# Patient Record
Sex: Female | Born: 1950 | Race: White | Hispanic: No | Marital: Married | Smoking: Former smoker
Health system: Southern US, Community
[De-identification: ages and names within clinical notes are randomized; demographics above are authoritative.]

## PROBLEM LIST (undated history)

## (undated) DIAGNOSIS — Z9889 Other specified postprocedural states: Secondary | ICD-10-CM

## (undated) DIAGNOSIS — R112 Nausea with vomiting, unspecified: Secondary | ICD-10-CM

## (undated) DIAGNOSIS — L989 Disorder of the skin and subcutaneous tissue, unspecified: Secondary | ICD-10-CM

## (undated) DIAGNOSIS — F329 Major depressive disorder, single episode, unspecified: Secondary | ICD-10-CM

## (undated) DIAGNOSIS — R519 Headache, unspecified: Secondary | ICD-10-CM

## (undated) DIAGNOSIS — F32A Depression, unspecified: Secondary | ICD-10-CM

## (undated) DIAGNOSIS — F419 Anxiety disorder, unspecified: Secondary | ICD-10-CM

## (undated) DIAGNOSIS — M1990 Unspecified osteoarthritis, unspecified site: Secondary | ICD-10-CM

## (undated) DIAGNOSIS — I10 Essential (primary) hypertension: Secondary | ICD-10-CM

## (undated) HISTORY — PX: FOOT SURGERY: SHX648

## (undated) HISTORY — PX: ROTATOR CUFF REPAIR: SHX139

## (undated) HISTORY — PX: APPENDECTOMY: SHX54

## (undated) HISTORY — PX: NECK SURGERY: SHX720

## (undated) HISTORY — PX: ABDOMINAL HYSTERECTOMY: SHX81

---
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
## 2020-01-07 ENCOUNTER — Other Ambulatory Visit: Payer: Self-pay | Admitting: Orthopedic Surgery

## 2020-01-12 ENCOUNTER — Encounter (HOSPITAL_COMMUNITY): Payer: Self-pay

## 2020-01-12 NOTE — Patient Instructions (Signed)
DUE TO COVID-19 ONLY ONE VISITOR ARE ALLOWED TO COME WITH YOU AND STAY IN THE WAITING ROOM ONLY DURING PRE OP AND PROCEDURE. THEN TWO VISITORS MAY VISIT WITH YOU IN YOUR PRIVATE ROOM DURING VISITING HOURS ONLY!! (10AM-8PM)   COVID SWAB TESTING MUST BE COMPLETED ON:  Today at 1:05 PM   [address] [place], [place] [address]  (Must self quarantine after testing. Follow instructions on handout.)       Your procedure is scheduled on: Thursday, Aug. 5, 2021   Report to [facility] Main  Entrance   Report to Short Stay at 5:30 AM   [facility])   Call this number if you have problems the morning of surgery [phone]   Do not eat food :After Midnight.   May have liquids until 4:30 AM   day of surgery  CLEAR LIQUID DIET  Foods Allowed                                                                     Foods Excluded  Water, Black Coffee and tea, regular and decaf                             liquids that you cannot  Plain Jell-O in any flavor  (No red)                                           see through such as: Fruit ices (not with fruit pulp)                                     milk, soups, orange juice              Iced Popsicles (No red)                                    All solid food                                   Apple juices Sports drinks like Gatorade (No red) Lightly seasoned clear broth or consume(fat free) Sugar, honey syrup  Sample Menu Breakfast                                Lunch                                     Supper Cranberry juice                    Beef broth                            Chicken broth Jell-O  Grape juice                           Apple juice Coffee or tea                        Jell-O                                      Popsicle                                                Coffee or tea                        Coffee or tea      Complete one Ensure drink the morning of surgery at    4:30  AM   the day of surgery.   Oral Hygiene is also important to reduce your risk of infection.                                    Remember - BRUSH YOUR TEETH THE MORNING OF SURGERY WITH YOUR REGULAR TOOTHPASTE   Do NOT smoke after Midnight   Take these medicines the morning of surgery with A SIP OF WATER: Venlafaxine                               You may not have any metal on your body including hair pins, jewelry, and body piercings             Do not wear make-up, lotions, powders, perfumes/cologne, or deodorant             Do not wear nail polish.  Do not shave  48 hours prior to surgery.                Do not bring valuables to the hospital. [facility] IS NOT             RESPONSIBLE   FOR VALUABLES.   Contacts, dentures or bridgework may not be worn into surgery.   Bring small overnight bag day of surgery.   Special Instructions: Bring a copy of your healthcare power of attorney and living will documents         the day of surgery if you haven't scanned them in before.              Please read over the following fact sheets you were given: IF YOU HAVE QUESTIONS ABOUT YOUR PRE OP INSTRUCTIONS PLEASE CALL [phone]  [place]- Preparing for Total Shoulder Arthroplasty    Before surgery, you can play an important role. Because skin is not sterile, your skin needs to be as free of germs as possible. You can reduce the number of germs on your skin by using the following products. . Benzoyl Peroxide Gel o Reduces the number of germs present on the skin o Applied twice a day to shoulder area starting two days before surgery    ==================================================================  Please follow these instructions carefully:  BENZOYL PEROXIDE 5%  GEL  Please do not use if you have an allergy to benzoyl peroxide.   If your skin becomes reddened/irritated stop using the benzoyl peroxide.  Starting two days before surgery, apply as follows: (Tuesday and  Wednesday) 1. Apply benzoyl peroxide in the morning and at night. Apply after taking a shower. If you are not taking a shower clean entire shoulder front, back, and side along with the armpit with a clean wet washcloth.  2. Place a quarter-sized dollop on your shoulder and rub in thoroughly, making sure to cover the front, back, and side of your shoulder, along with the armpit.   2 days before ____ AM   ____ PM              1 day before ____ AM   ____ PM                         3. Do this twice a day for two days.  (Last application is the night before surgery, AFTER using the CHG soap as described below).  4. Do NOT apply benzoyl peroxide gel on the day of surgery.    [place] - Preparing for Surgery Before surgery, you can play an important role.  Because skin is not sterile, your skin needs to be as free of germs as possible.  You can reduce the number of germs on your skin by washing with CHG (chlorahexidine gluconate) soap before surgery.  CHG is an antiseptic cleaner which kills germs and bonds with the skin to continue killing germs even after washing. Please DO NOT use if you have an allergy to CHG or antibacterial soaps.  If your skin becomes reddened/irritated stop using the CHG and inform your nurse when you arrive at Short Stay. Do not shave (including legs and underarms) for at least 48 hours prior to the first CHG shower.  You may shave your face/neck.  Please follow these instructions carefully:  1.  Shower with CHG Soap the night before surgery and the  morning of surgery.  2.  If you choose to wash your hair, wash your hair first as usual with your normal  shampoo.  3.  After you shampoo, rinse your hair and body thoroughly to remove the shampoo.                             4.  Use CHG as you would any other liquid soap.  You can apply chg directly to the skin and wash.  Gently with a scrungie or clean washcloth.  5.  Apply the CHG Soap to your body ONLY FROM THE NECK DOWN.    Do   not use on face/ open                           Wound or open sores. Avoid contact with eyes, ears mouth and   genitals (private parts).                       Wash face,  Genitals (private parts) with your normal soap.             6.  Wash thoroughly, paying special attention to the area where your    surgery  will be performed.  7.  Thoroughly rinse your body with warm water from the neck down.  8.  DO NOT shower/wash with your normal soap after using and rinsing off the CHG Soap.                9.  Pat yourself dry with a clean towel.            10.  Wear clean pajamas.            11.  Place clean sheets on your bed the night of your first shower and do not  sleep with pets. Day of Surgery : Do not apply any lotions/deodorants the morning of surgery.  Please wear clean clothes to the hospital/surgery center.  FAILURE TO FOLLOW THESE INSTRUCTIONS MAY RESULT IN THE CANCELLATION OF YOUR SURGERY  PATIENT SIGNATURE_________________________________  NURSE SIGNATURE__________________________________  ________________________________________________________________________  WHAT IS A BLOOD TRANSFUSION? Blood Transfusion Information  A transfusion is the replacement of blood or some of its parts. Blood is made up of multiple cells which provide different functions.  Red blood cells carry oxygen and are used for blood loss replacement.  White blood cells fight against infection.  Platelets control bleeding.  Plasma helps clot blood.  Other blood products are available for specialized needs, such as hemophilia or other clotting disorders. BEFORE THE TRANSFUSION  Who gives blood for transfusions?   Healthy volunteers who are fully evaluated to make sure their blood is safe. This is blood bank blood. Transfusion therapy is the safest it has ever been in the practice of medicine. Before blood is taken from a donor, a complete history is taken to make sure that person has no history of  diseases nor engages in risky social behavior (examples are intravenous drug use or sexual activity with multiple partners). The donor's travel history is screened to minimize risk of transmitting infections, such as malaria. The donated blood is tested for signs of infectious diseases, such as HIV and hepatitis. The blood is then tested to be sure it is compatible with you in order to minimize the chance of a transfusion reaction. If you or a relative donates blood, this is often done in anticipation of surgery and is not appropriate for emergency situations. It takes many days to process the donated blood. RISKS AND COMPLICATIONS Although transfusion therapy is very safe and saves many lives, the main dangers of transfusion include:   Getting an infectious disease.  Developing a transfusion reaction. This is an allergic reaction to something in the blood you were given. Every precaution is taken to prevent this. The decision to have a blood transfusion has been considered carefully by your caregiver before blood is given. Blood is not given unless the benefits outweigh the risks. AFTER THE TRANSFUSION  Right after receiving a blood transfusion, you will usually feel much better and more energetic. This is especially true if your red blood cells have gotten low (anemic). The transfusion raises the level of the red blood cells which carry oxygen, and this usually causes an energy increase.  The nurse administering the transfusion will monitor you carefully for complications. HOME CARE INSTRUCTIONS  No special instructions are needed after a transfusion. You may find your energy is better. Speak with your caregiver about any limitations on activity for underlying diseases you may have. SEEK MEDICAL CARE IF:   Your condition is not improving after your transfusion.  You develop redness or irritation at the intravenous (IV) site. SEEK IMMEDIATE MEDICAL CARE IF:  Any of the following symptoms occur  over the next 12 hours:  Shaking chills.  You  have a temperature by mouth above 102 F (38.9 C), not controlled by medicine.  Chest, back, or muscle pain.  People around you feel you are not acting correctly or are confused.  Shortness of breath or difficulty breathing.  Dizziness and fainting.  You get a rash or develop hives.  You have a decrease in urine output.  Your urine turns a dark color or changes to pink, red, or brown. Any of the following symptoms occur over the next 10 days:  You have a temperature by mouth above 102 F (38.9 C), not controlled by medicine.  Shortness of breath.  Weakness after normal activity.  The white part of the eye turns yellow (jaundice).  You have a decrease in the amount of urine or are urinating less often.  Your urine turns a dark color or changes to pink, red, or brown. Document Released: 06/01/2000 Document Revised: 08/27/2011 Document Reviewed: 01/19/2008 [facility] Patient Information 2014 [place], [place].  _______________________________________________________________________

## 2020-01-12 NOTE — Progress Notes (Signed)
COVID Vaccine Completed: Date COVID Vaccine completed: COVID vaccine manufacturer: Pfizer    Moderna   [employer]   PCP -  Cardiologist -   Chest x-ray -  EKG - 01/18/2020 in epic (pre op) Stress Test -  ECHO -  Cardiac Cath -   Sleep Study -  CPAP -   Fasting Blood Sugar -  Checks Blood Sugar _____ times a day  Blood Thinner Instructions: Aspirin Instructions: Last Dose:  Anesthesia review:   Patient denies shortness of breath, fever, cough and chest pain at PAT appointment   Patient verbalized understanding of instructions that were given to them at the PAT appointment. Patient was also instructed that they will need to review over the PAT instructions again at home before surgery.

## 2020-01-18 ENCOUNTER — Ambulatory Visit (HOSPITAL_COMMUNITY)
Admission: RE | Admit: 2020-01-18 | Discharge: 2020-01-18 | Disposition: A | Discharge status: Home / Self Care | Payer: Worker's Compensation | Source: Ambulatory Visit | Attending: Orthopedic Surgery | Admitting: Orthopedic Surgery

## 2020-01-18 ENCOUNTER — Other Ambulatory Visit: Payer: Self-pay

## 2020-01-18 ENCOUNTER — Encounter (HOSPITAL_COMMUNITY): Payer: Self-pay

## 2020-01-18 ENCOUNTER — Encounter (HOSPITAL_COMMUNITY)
Admission: RE | Admit: 2020-01-18 | Discharge: 2020-01-18 | Disposition: A | Discharge status: Home / Self Care | Payer: Worker's Compensation | Source: Ambulatory Visit | Attending: Orthopedic Surgery | Admitting: Orthopedic Surgery

## 2020-01-18 ENCOUNTER — Other Ambulatory Visit (HOSPITAL_COMMUNITY)
Admission: RE | Admit: 2020-01-18 | Discharge: 2020-01-18 | Disposition: A | Discharge status: Home / Self Care | Payer: Worker's Compensation | Source: Ambulatory Visit | Attending: Orthopedic Surgery | Admitting: Orthopedic Surgery

## 2020-01-18 DIAGNOSIS — Z20822 Contact with and (suspected) exposure to covid-19: Secondary | ICD-10-CM | POA: Insufficient documentation

## 2020-01-18 DIAGNOSIS — Z01811 Encounter for preprocedural respiratory examination: Secondary | ICD-10-CM

## 2020-01-18 DIAGNOSIS — Z01818 Encounter for other preprocedural examination: Secondary | ICD-10-CM | POA: Diagnosis present

## 2020-01-18 DIAGNOSIS — Z01812 Encounter for preprocedural laboratory examination: Secondary | ICD-10-CM | POA: Diagnosis present

## 2020-01-18 HISTORY — DX: Anxiety disorder, unspecified: F41.9

## 2020-01-18 HISTORY — DX: Other specified postprocedural states: R11.2

## 2020-01-18 HISTORY — DX: Depression, unspecified: F32.A

## 2020-01-18 HISTORY — DX: Unspecified osteoarthritis, unspecified site: M19.90

## 2020-01-18 HISTORY — DX: Disorder of the skin and subcutaneous tissue, unspecified: L98.9

## 2020-01-18 HISTORY — DX: Other specified postprocedural states: Z98.890

## 2020-01-18 HISTORY — DX: Headache, unspecified: R51.9

## 2020-01-18 HISTORY — DX: Essential (primary) hypertension: I10

## 2020-01-18 LAB — COMPREHENSIVE METABOLIC PANEL
ALT: 10 U/L (ref 0–44)
AST: 21 U/L (ref 15–41)
Albumin: 4.6 g/dL (ref 3.5–5.0)
Alkaline Phosphatase: 105 U/L (ref 38–126)
Anion gap: 9 (ref 5–15)
BUN: 20 mg/dL (ref 8–23)
CO2: 27 mmol/L (ref 22–32)
Calcium: 9.7 mg/dL (ref 8.9–10.3)
Chloride: 98 mmol/L (ref 98–111)
Creatinine, Ser: 1.18 mg/dL — ABNORMAL HIGH (ref 0.44–1.00)
GFR calc Af Amer: 55 mL/min — ABNORMAL LOW (ref >60)
GFR calc non Af Amer: 48 mL/min — ABNORMAL LOW (ref >60)
Glucose, Bld: 75 mg/dL (ref 70–99)
Potassium: 5 mmol/L (ref 3.5–5.1)
Sodium: 134 mmol/L — ABNORMAL LOW (ref 135–145)
Total Bilirubin: 0.6 mg/dL (ref 0.3–1.2)
Total Protein: 7.6 g/dL (ref 6.5–8.1)

## 2020-01-18 LAB — CBC WITH DIFFERENTIAL/PLATELET
Abs Immature Granulocytes: 0.01 10*3/uL (ref 0.00–0.07)
Basophils Absolute: 0.1 10*3/uL (ref 0.0–0.1)
Basophils Relative: 1 %
Eosinophils Absolute: 0.1 10*3/uL (ref 0.0–0.5)
Eosinophils Relative: 3 %
HCT: 39.1 % (ref 36.0–46.0)
Hemoglobin: 12.5 g/dL (ref 12.0–15.0)
Immature Granulocytes: 0 %
Lymphocytes Relative: 29 %
Lymphs Abs: 1.1 10*3/uL (ref 0.7–4.0)
MCH: 30.2 pg (ref 26.0–34.0)
MCHC: 32 g/dL (ref 30.0–36.0)
MCV: 94.4 fL (ref 80.0–100.0)
Monocytes Absolute: 0.3 10*3/uL (ref 0.1–1.0)
Monocytes Relative: 10 %
Neutro Abs: 2 10*3/uL (ref 1.7–7.7)
Neutrophils Relative %: 57 %
Platelets: 337 10*3/uL (ref 150–400)
RBC: 4.14 MIL/uL (ref 3.87–5.11)
RDW: 12.9 % (ref 11.5–15.5)
WBC: 3.6 10*3/uL — ABNORMAL LOW (ref 4.0–10.5)
nRBC: 0 % (ref 0.0–0.2)

## 2020-01-18 LAB — URINALYSIS, ROUTINE W REFLEX MICROSCOPIC
Bilirubin Urine: NEGATIVE
Glucose, UA: NEGATIVE mg/dL
Hgb urine dipstick: NEGATIVE
Ketones, ur: NEGATIVE mg/dL
Leukocytes,Ua: NEGATIVE
Nitrite: NEGATIVE
Protein, ur: NEGATIVE mg/dL
Specific Gravity, Urine: 1.013 (ref 1.005–1.030)
pH: 5 (ref 5.0–8.0)

## 2020-01-18 LAB — SURGICAL PCR SCREEN
MRSA, PCR: NEGATIVE
Staphylococcus aureus: NEGATIVE

## 2020-01-18 LAB — APTT: aPTT: 28 seconds (ref 24–36)

## 2020-01-18 LAB — SARS CORONAVIRUS 2 (TAT 6-24 HRS): SARS Coronavirus 2: NEGATIVE

## 2020-01-18 LAB — PROTIME-INR
INR: 1 (ref 0.8–1.2)
Prothrombin Time: 12.3 seconds (ref 11.4–15.2)

## 2020-01-18 NOTE — Progress Notes (Signed)
COVID Vaccine Completed: No Date COVID Vaccine completed: COVID vaccine manufacturer: Pfizer    Moderna   [employer]   PCP - Dr. Nickolas Madrid Cardiologist - N/A  Chest x-ray - N/A EKG - 01-18-20 in Epic Stress Test - N/A ECHO - N/A Cardiac Cath - N/A  Sleep Study - N/A CPAP -   Fasting Blood Sugar - N/A Checks Blood Sugar _____ times a day  Blood Thinner Instructions:N/A Aspirin Instructions:  81 mg. Patient to check with surgeon Last Dose:  Anesthesia review:   Patient denies shortness of breath, fever, cough and chest pain at PAT appointment   Patient verbalized understanding of instructions that were given to them at the PAT appointment. Patient was also instructed that they will need to review over the PAT instructions again at home before surgery.

## 2020-01-21 ENCOUNTER — Encounter (HOSPITAL_COMMUNITY): Payer: Self-pay | Admitting: Orthopedic Surgery

## 2020-01-21 ENCOUNTER — Ambulatory Visit (HOSPITAL_COMMUNITY): Payer: Worker's Compensation | Admitting: Certified Registered"

## 2020-01-21 ENCOUNTER — Ambulatory Visit (HOSPITAL_COMMUNITY): Payer: Worker's Compensation | Attending: Surgical

## 2020-01-21 ENCOUNTER — Other Ambulatory Visit: Payer: Self-pay

## 2020-01-21 ENCOUNTER — Observation Stay (HOSPITAL_COMMUNITY)
Admission: RE | Admit: 2020-01-21 | Discharge: 2020-01-22 | Disposition: A | Discharge status: Home / Self Care | Payer: Worker's Compensation | Attending: Orthopedic Surgery | Admitting: Orthopedic Surgery

## 2020-01-21 ENCOUNTER — Encounter (HOSPITAL_COMMUNITY)
Admission: RE | Disposition: A | Discharge status: Home / Self Care | Payer: Self-pay | Source: Home / Self Care | Attending: Orthopedic Surgery

## 2020-01-21 DIAGNOSIS — Z79899 Other long term (current) drug therapy: Secondary | ICD-10-CM | POA: Diagnosis not present

## 2020-01-21 DIAGNOSIS — Z7982 Long term (current) use of aspirin: Secondary | ICD-10-CM | POA: Diagnosis not present

## 2020-01-21 DIAGNOSIS — Z91010 Allergy to peanuts: Secondary | ICD-10-CM | POA: Insufficient documentation

## 2020-01-21 DIAGNOSIS — I10 Essential (primary) hypertension: Secondary | ICD-10-CM | POA: Diagnosis not present

## 2020-01-21 DIAGNOSIS — Z96611 Presence of right artificial shoulder joint: Secondary | ICD-10-CM | POA: Diagnosis not present

## 2020-01-21 DIAGNOSIS — M75101 Unspecified rotator cuff tear or rupture of right shoulder, not specified as traumatic: Secondary | ICD-10-CM | POA: Diagnosis not present

## 2020-01-21 DIAGNOSIS — M25511 Pain in right shoulder: Secondary | ICD-10-CM | POA: Diagnosis present

## 2020-01-21 DIAGNOSIS — F17210 Nicotine dependence, cigarettes, uncomplicated: Secondary | ICD-10-CM | POA: Insufficient documentation

## 2020-01-21 HISTORY — PX: REVERSE SHOULDER ARTHROPLASTY: SHX5054

## 2020-01-21 LAB — TYPE AND SCREEN
ABO/RH(D): O POS
Antibody Screen: NEGATIVE

## 2020-01-21 LAB — ABO/RH: ABO/RH(D): O POS

## 2020-01-21 SURGERY — ARTHROPLASTY, SHOULDER, TOTAL, REVERSE
Anesthesia: General | Site: Shoulder | Laterality: Right

## 2020-01-21 MED ORDER — POLYETHYLENE GLYCOL 3350 17 G PO PACK: 17.0000 g | PACK | Freq: Every day | ORAL | Status: DC | PRN

## 2020-01-21 MED ORDER — ONDANSETRON HCL 4 MG/2ML IJ SOLN
4.0000 mg | Freq: Four times a day (QID) | INTRAMUSCULAR | Status: DC | PRN
  Administered 2020-01-21: 4 mg via INTRAVENOUS
  Filled 2020-01-21: qty 2

## 2020-01-21 MED ORDER — TIZANIDINE HCL 4 MG PO TABS
8.0000 mg | ORAL_TABLET | Freq: Every day | ORAL | Status: DC
  Administered 2020-01-21: 8 mg via ORAL
  Filled 2020-01-21: qty 2

## 2020-01-21 MED ORDER — HYDROCODONE-ACETAMINOPHEN 5-325 MG PO TABS: 1.0000 | ORAL_TABLET | ORAL | Status: DC | PRN

## 2020-01-21 MED ORDER — VENLAFAXINE HCL ER 150 MG PO CP24
150.0000 mg | ORAL_CAPSULE | Freq: Every day | ORAL | Status: DC
  Administered 2020-01-22: 150 mg via ORAL
  Filled 2020-01-21: qty 1

## 2020-01-21 MED ORDER — SCOPOLAMINE 1 MG/3DAYS TD PT72
MEDICATED_PATCH | TRANSDERMAL | Status: AC
  Filled 2020-01-21: qty 1

## 2020-01-21 MED ORDER — PHENYLEPHRINE HCL (PRESSORS) 10 MG/ML IV SOLN
INTRAVENOUS | Status: AC
  Filled 2020-01-21: qty 1

## 2020-01-21 MED ORDER — CLONIDINE HCL 0.1 MG PO TABS
0.1000 mg | ORAL_TABLET | Freq: Every day | ORAL | Status: DC
  Administered 2020-01-21: 0.1 mg via ORAL
  Filled 2020-01-21: qty 1

## 2020-01-21 MED ORDER — PHENOL 1.4 % MT LIQD: 1.0000 | OROMUCOSAL | Status: DC | PRN

## 2020-01-21 MED ORDER — SCOPOLAMINE 1 MG/3DAYS TD PT72
MEDICATED_PATCH | TRANSDERMAL | Status: DC | PRN
  Administered 2020-01-21: 1 via TRANSDERMAL

## 2020-01-21 MED ORDER — DIPHENHYDRAMINE HCL 12.5 MG/5ML PO ELIX: 12.5000 mg | ORAL_SOLUTION | ORAL | Status: DC | PRN

## 2020-01-21 MED ORDER — SODIUM CHLORIDE 0.9 % IV SOLN: INTRAVENOUS | Status: AC

## 2020-01-21 MED ORDER — HYDROCODONE-ACETAMINOPHEN 7.5-325 MG PO TABS: 1.0000 | ORAL_TABLET | ORAL | Status: DC | PRN

## 2020-01-21 MED ORDER — TRAZODONE HCL 100 MG PO TABS
300.0000 mg | ORAL_TABLET | Freq: Every day | ORAL | Status: DC
  Administered 2020-01-21: 300 mg via ORAL
  Filled 2020-01-21: qty 3

## 2020-01-21 MED ORDER — KETOROLAC TROMETHAMINE 30 MG/ML IJ SOLN: 30.0000 mg | Freq: Once | INTRAMUSCULAR | Status: DC | PRN

## 2020-01-21 MED ORDER — DEXAMETHASONE SODIUM PHOSPHATE 10 MG/ML IJ SOLN
INTRAMUSCULAR | Status: DC | PRN
  Administered 2020-01-21: 6 mg via INTRAVENOUS

## 2020-01-21 MED ORDER — VENLAFAXINE HCL ER 75 MG PO CP24
75.0000 mg | ORAL_CAPSULE | Freq: Every day | ORAL | Status: DC
  Administered 2020-01-22: 75 mg via ORAL
  Filled 2020-01-21: qty 1

## 2020-01-21 MED ORDER — METOCLOPRAMIDE HCL 5 MG/ML IJ SOLN: 5.0000 mg | Freq: Three times a day (TID) | INTRAMUSCULAR | Status: DC | PRN

## 2020-01-21 MED ORDER — ALUM & MAG HYDROXIDE-SIMETH 200-200-20 MG/5ML PO SUSP: 30.0000 mL | ORAL | Status: DC | PRN

## 2020-01-21 MED ORDER — FENTANYL CITRATE (PF) 100 MCG/2ML IJ SOLN: 25.0000 ug | INTRAMUSCULAR | Status: DC | PRN

## 2020-01-21 MED ORDER — ORAL CARE MOUTH RINSE: 15.0000 mL | Freq: Once | OROMUCOSAL | Status: AC

## 2020-01-21 MED ORDER — EPHEDRINE 5 MG/ML INJ
INTRAVENOUS | Status: AC
  Filled 2020-01-21: qty 10

## 2020-01-21 MED ORDER — PROPOFOL 10 MG/ML IV BOLUS
INTRAVENOUS | Status: DC | PRN
  Administered 2020-01-21: 150 mg via INTRAVENOUS

## 2020-01-21 MED ORDER — LACTATED RINGERS IV SOLN: INTRAVENOUS | Status: DC

## 2020-01-21 MED ORDER — ONDANSETRON HCL 4 MG/2ML IJ SOLN
INTRAMUSCULAR | Status: DC | PRN
  Administered 2020-01-21: 4 mg via INTRAVENOUS

## 2020-01-21 MED ORDER — CHLORHEXIDINE GLUCONATE 0.12 % MT SOLN
15.0000 mL | Freq: Once | OROMUCOSAL | Status: AC
  Administered 2020-01-21: 15 mL via OROMUCOSAL

## 2020-01-21 MED ORDER — EPHEDRINE SULFATE-NACL 50-0.9 MG/10ML-% IV SOSY
PREFILLED_SYRINGE | INTRAVENOUS | Status: DC | PRN
  Administered 2020-01-21 (×5): 10 mg via INTRAVENOUS

## 2020-01-21 MED ORDER — MIDAZOLAM HCL 2 MG/2ML IJ SOLN
INTRAMUSCULAR | Status: DC | PRN
  Administered 2020-01-21 (×2): 1 mg via INTRAVENOUS

## 2020-01-21 MED ORDER — WATER FOR IRRIGATION, STERILE IR SOLN
Status: DC | PRN
  Administered 2020-01-21: 2000 mL via SURGICAL_CAVITY

## 2020-01-21 MED ORDER — ONDANSETRON HCL 4 MG/2ML IJ SOLN
INTRAMUSCULAR | Status: AC
  Filled 2020-01-21: qty 2

## 2020-01-21 MED ORDER — PHENYLEPHRINE HCL-NACL 10-0.9 MG/250ML-% IV SOLN
INTRAVENOUS | Status: DC | PRN
Start: 2020-01-21 — End: 2020-01-21
  Administered 2020-01-21: 20 ug/min via INTRAVENOUS

## 2020-01-21 MED ORDER — CEFAZOLIN SODIUM-DEXTROSE 1-4 GM/50ML-% IV SOLN
1.0000 g | Freq: Four times a day (QID) | INTRAVENOUS | Status: AC
  Administered 2020-01-21 – 2020-01-22 (×3): 1 g via INTRAVENOUS
  Filled 2020-01-21 (×3): qty 50

## 2020-01-21 MED ORDER — MIDAZOLAM HCL 2 MG/2ML IJ SOLN
INTRAMUSCULAR | Status: AC
  Filled 2020-01-21: qty 2

## 2020-01-21 MED ORDER — MENTHOL 3 MG MT LOZG: 1.0000 | LOZENGE | OROMUCOSAL | Status: DC | PRN

## 2020-01-21 MED ORDER — BISACODYL 5 MG PO TBEC: 5.0000 mg | DELAYED_RELEASE_TABLET | Freq: Every day | ORAL | Status: DC | PRN

## 2020-01-21 MED ORDER — 0.9 % SODIUM CHLORIDE (POUR BTL) OPTIME
TOPICAL | Status: DC | PRN
  Administered 2020-01-21: 1000 mL

## 2020-01-21 MED ORDER — OXYCODONE-ACETAMINOPHEN 5-325 MG PO TABS
1.0000 | ORAL_TABLET | ORAL | Status: DC | PRN
  Administered 2020-01-21 – 2020-01-22 (×2): 1 via ORAL
  Filled 2020-01-21 (×2): qty 1

## 2020-01-21 MED ORDER — BUPIVACAINE HCL (PF) 0.5 % IJ SOLN
INTRAMUSCULAR | Status: DC | PRN
  Administered 2020-01-21: 15 mL via PERINEURAL

## 2020-01-21 MED ORDER — SUGAMMADEX SODIUM 200 MG/2ML IV SOLN
INTRAVENOUS | Status: DC | PRN
  Administered 2020-01-21: 140 mg via INTRAVENOUS

## 2020-01-21 MED ORDER — ONDANSETRON HCL 4 MG PO TABS
4.0000 mg | ORAL_TABLET | Freq: Four times a day (QID) | ORAL | Status: DC | PRN
  Administered 2020-01-21: 4 mg via ORAL
  Filled 2020-01-21: qty 1

## 2020-01-21 MED ORDER — CEFAZOLIN SODIUM-DEXTROSE 2-4 GM/100ML-% IV SOLN
2.0000 g | INTRAVENOUS | Status: AC
  Administered 2020-01-21: 2 g via INTRAVENOUS
  Filled 2020-01-21: qty 100

## 2020-01-21 MED ORDER — DEXAMETHASONE SODIUM PHOSPHATE 10 MG/ML IJ SOLN
INTRAMUSCULAR | Status: AC
  Filled 2020-01-21: qty 1

## 2020-01-21 MED ORDER — ASPIRIN EC 325 MG PO TBEC
325.0000 mg | DELAYED_RELEASE_TABLET | Freq: Two times a day (BID) | ORAL | Status: DC
  Administered 2020-01-21 – 2020-01-22 (×3): 325 mg via ORAL
  Filled 2020-01-21 (×3): qty 1

## 2020-01-21 MED ORDER — PROPOFOL 1000 MG/100ML IV EMUL
INTRAVENOUS | Status: AC
  Filled 2020-01-21: qty 100

## 2020-01-21 MED ORDER — METOCLOPRAMIDE HCL 5 MG PO TABS: 5.0000 mg | ORAL_TABLET | Freq: Three times a day (TID) | ORAL | Status: DC | PRN

## 2020-01-21 MED ORDER — ROCURONIUM BROMIDE 10 MG/ML (PF) SYRINGE
PREFILLED_SYRINGE | INTRAVENOUS | Status: AC
  Filled 2020-01-21: qty 10

## 2020-01-21 MED ORDER — LISINOPRIL 10 MG PO TABS
10.0000 mg | ORAL_TABLET | Freq: Every day | ORAL | Status: DC
  Administered 2020-01-21 – 2020-01-22 (×2): 10 mg via ORAL
  Filled 2020-01-21 (×2): qty 1

## 2020-01-21 MED ORDER — BUPIVACAINE LIPOSOME 1.3 % IJ SUSP
INTRAMUSCULAR | Status: DC | PRN
  Administered 2020-01-21: 10 mL via PERINEURAL

## 2020-01-21 MED ORDER — FENTANYL CITRATE (PF) 250 MCG/5ML IJ SOLN
INTRAMUSCULAR | Status: DC | PRN
  Administered 2020-01-21 (×2): 50 ug via INTRAVENOUS

## 2020-01-21 MED ORDER — LIDOCAINE 2% (20 MG/ML) 5 ML SYRINGE
INTRAMUSCULAR | Status: AC
  Filled 2020-01-21: qty 5

## 2020-01-21 MED ORDER — MORPHINE SULFATE (PF) 2 MG/ML IV SOLN: 0.5000 mg | INTRAVENOUS | Status: DC | PRN

## 2020-01-21 MED ORDER — PROPOFOL 10 MG/ML IV BOLUS
INTRAVENOUS | Status: AC
  Filled 2020-01-21: qty 40

## 2020-01-21 MED ORDER — FLEET ENEMA 7-19 GM/118ML RE ENEM: 1.0000 | ENEMA | Freq: Once | RECTAL | Status: DC | PRN

## 2020-01-21 MED ORDER — TRANEXAMIC ACID-NACL 1000-0.7 MG/100ML-% IV SOLN
1000.0000 mg | INTRAVENOUS | Status: AC
  Administered 2020-01-21: 1000 mg via INTRAVENOUS
  Filled 2020-01-21: qty 100

## 2020-01-21 MED ORDER — ACETAMINOPHEN 325 MG PO TABS
325.0000 mg | ORAL_TABLET | Freq: Four times a day (QID) | ORAL | Status: DC | PRN
  Administered 2020-01-21 (×2): 650 mg via ORAL
  Filled 2020-01-21 (×2): qty 2

## 2020-01-21 MED ORDER — FENTANYL CITRATE (PF) 100 MCG/2ML IJ SOLN
INTRAMUSCULAR | Status: AC
  Filled 2020-01-21: qty 2

## 2020-01-21 MED ORDER — ROCURONIUM BROMIDE 10 MG/ML (PF) SYRINGE
PREFILLED_SYRINGE | INTRAVENOUS | Status: DC | PRN
  Administered 2020-01-21: 60 mg via INTRAVENOUS

## 2020-01-21 MED ORDER — OXYCODONE-ACETAMINOPHEN 5-325 MG PO TABS: ORAL_TABLET | ORAL | 0 refills | Status: AC

## 2020-01-21 MED ORDER — DOCUSATE SODIUM 100 MG PO CAPS
100.0000 mg | ORAL_CAPSULE | Freq: Two times a day (BID) | ORAL | Status: DC
  Administered 2020-01-21 – 2020-01-22 (×3): 100 mg via ORAL
  Filled 2020-01-21 (×3): qty 1

## 2020-01-21 MED ORDER — DIAZEPAM 5 MG PO TABS
5.0000 mg | ORAL_TABLET | Freq: Every day | ORAL | Status: DC
  Administered 2020-01-21: 5 mg via ORAL
  Filled 2020-01-21: qty 1

## 2020-01-21 SURGICAL SUPPLY — 75 items
BAG ZIPLOCK 12X15 (MISCELLANEOUS) ×2 IMPLANT
BASEPLATE P2 COATD GLND 6.5X30 (Shoulder) ×1 IMPLANT
BIT DRILL 1.6MX128 (BIT) IMPLANT
BIT DRILL 2.5 DIA 127 CALI (BIT) ×2 IMPLANT
BIT DRILL 4 DIA CALIBRATED (BIT) ×2 IMPLANT
BLADE SAW SAG 73X25 THK (BLADE) ×1
BLADE SAW SGTL 73X25 THK (BLADE) ×1 IMPLANT
BOOTIES KNEE HIGH SLOAN (MISCELLANEOUS) ×4 IMPLANT
CLSR STERI-STRIP ANTIMIC 1/2X4 (GAUZE/BANDAGES/DRESSINGS) ×2 IMPLANT
COOLER ICEMAN CLASSIC (MISCELLANEOUS) IMPLANT
COVER BACK TABLE 60X90IN (DRAPES) ×2 IMPLANT
COVER SURGICAL LIGHT HANDLE (MISCELLANEOUS) ×2 IMPLANT
COVER WAND RF STERILE (DRAPES) IMPLANT
DRAPE INCISE IOBAN 66X45 STRL (DRAPES) ×2 IMPLANT
DRAPE ORTHO SPLIT 77X108 STRL (DRAPES) ×2
DRAPE POUCH INSTRU U-SHP 10X18 (DRAPES) ×2 IMPLANT
DRAPE SHEET LG 3/4 BI-LAMINATE (DRAPES) ×4 IMPLANT
DRAPE SURG 17X11 SM STRL (DRAPES) ×2 IMPLANT
DRAPE SURG ORHT 6 SPLT 77X108 (DRAPES) ×2 IMPLANT
DRAPE TOP 10253 STERILE (DRAPES) ×2 IMPLANT
DRAPE U-SHAPE 47X51 STRL (DRAPES) ×2 IMPLANT
DRSG AQUACEL AG ADV 3.5X 6 (GAUZE/BANDAGES/DRESSINGS) ×2 IMPLANT
DURAPREP 26ML APPLICATOR (WOUND CARE) ×2 IMPLANT
ELECT BLADE TIP CTD 4 INCH (ELECTRODE) ×2 IMPLANT
ELECT REM PT RETURN 15FT ADLT (MISCELLANEOUS) ×2 IMPLANT
GLOVE BIO SURGEON STRL SZ7 (GLOVE) ×2 IMPLANT
GLOVE BIO SURGEON STRL SZ7.5 (GLOVE) ×2 IMPLANT
GLOVE BIOGEL PI IND STRL 7.0 (GLOVE) ×1 IMPLANT
GLOVE BIOGEL PI IND STRL 8 (GLOVE) ×1 IMPLANT
GLOVE BIOGEL PI INDICATOR 7.0 (GLOVE) ×1
GLOVE BIOGEL PI INDICATOR 8 (GLOVE) ×1
GOWN STRL REUS W/TWL LRG LVL3 (GOWN DISPOSABLE) ×2 IMPLANT
GOWN STRL REUS W/TWL XL LVL3 (GOWN DISPOSABLE) ×2 IMPLANT
HANDPIECE INTERPULSE COAX TIP (DISPOSABLE) ×1
HOOD PEEL AWAY FLYTE STAYCOOL (MISCELLANEOUS) ×6 IMPLANT
HUMERA STEM SM SHELL SHOU 10 (Miscellaneous) ×2 IMPLANT
INSERT SMALL SOCKET 32MM NEU (Insert) ×2 IMPLANT
KIT BASIN OR (CUSTOM PROCEDURE TRAY) ×2 IMPLANT
KIT TURNOVER KIT A (KITS) IMPLANT
MANIFOLD NEPTUNE II (INSTRUMENTS) ×2 IMPLANT
NEEDLE TROCAR POINT SZ 2 1/2 (NEEDLE) IMPLANT
NS IRRIG 1000ML POUR BTL (IV SOLUTION) ×2 IMPLANT
P2 COATDE GLNOID BSEPLT 6.5X30 (Shoulder) ×2 IMPLANT
PACK SHOULDER (CUSTOM PROCEDURE TRAY) ×2 IMPLANT
PAD COLD SHLDR WRAP-ON (PAD) IMPLANT
PROTECTOR NERVE ULNAR (MISCELLANEOUS) IMPLANT
RESTRAINT HEAD UNIVERSAL NS (MISCELLANEOUS) IMPLANT
RETRIEVER SUT HEWSON (MISCELLANEOUS) IMPLANT
SCREW BONE LOCKING RSP 5.0X14 (Screw) ×4 IMPLANT
SCREW BONE LOCKING RSP 5.0X30 (Screw) ×4 IMPLANT
SCREW BONE RSP LOCK 5X14 (Screw) ×2 IMPLANT
SCREW BONE RSP LOCK 5X30 (Screw) ×2 IMPLANT
SCREW RETAIN W/HEAD 4MM OFFSET (Shoulder) ×2 IMPLANT
SET HNDPC FAN SPRY TIP SCT (DISPOSABLE) ×1 IMPLANT
SLING ARM FOAM STRAP MED (SOFTGOODS) ×2 IMPLANT
SLING ARM IMMOBILIZER LRG (SOFTGOODS) IMPLANT
SLING ARM IMMOBILIZER MED (SOFTGOODS) IMPLANT
SPONGE LAP 18X18 RF (DISPOSABLE) ×2 IMPLANT
STEM HUMERAL SM SHELL SHOU 10 (Miscellaneous) ×1 IMPLANT
STRIP CLOSURE SKIN 1/2X4 (GAUZE/BANDAGES/DRESSINGS) IMPLANT
SUCTION FRAZIER HANDLE 10FR (MISCELLANEOUS)
SUCTION TUBE FRAZIER 10FR DISP (MISCELLANEOUS) IMPLANT
SUPPORT WRAP ARM LG (MISCELLANEOUS) IMPLANT
SUT ETHIBOND 2 V 37 (SUTURE) IMPLANT
SUT FIBERWIRE #2 38 REV NDL BL (SUTURE) ×2
SUT MNCRL AB 4-0 PS2 18 (SUTURE) ×2 IMPLANT
SUT VIC AB 2-0 CT1 27 (SUTURE) ×2
SUT VIC AB 2-0 CT1 TAPERPNT 27 (SUTURE) ×2 IMPLANT
SUTURE FIBERWR#2 38 REV NDL BL (SUTURE) ×1 IMPLANT
TAPE LABRALWHITE 1.5X36 (TAPE) IMPLANT
TAPE SUT LABRALTAP WHT/BLK (SUTURE) IMPLANT
TOWEL OR 17X26 10 PK STRL BLUE (TOWEL DISPOSABLE) ×2 IMPLANT
TOWEL OR NON WOVEN STRL DISP B (DISPOSABLE) ×2 IMPLANT
WATER STERILE IRR 1000ML POUR (IV SOLUTION) ×4 IMPLANT
YANKAUER SUCT BULB TIP 10FT TU (MISCELLANEOUS) ×2 IMPLANT
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
## 2020-01-21 NOTE — Anesthesia Procedure Notes (Signed)
Anesthesia Regional Block: Interscalene brachial plexus block   Pre-Anesthetic Checklist: ,, timeout performed, Correct Patient, Correct Site, Correct Laterality, Correct Procedure, Correct Position, site marked, Risks and benefits discussed,  Surgical consent,  Pre-op evaluation,  At surgeon's request and post-op pain management  Laterality: Right  Prep: chloraprep       Needles:  Injection technique: Single-shot  Needle Type: Echogenic Needle     Needle Length: 9cm      Additional Needles:   Procedures:,,,, ultrasound used (permanent image in chart),,,,  Narrative:  Start time: 01/21/2020 6:57 AM End time: 01/21/2020 7:05 AM Injection made incrementally with aspirations every 5 mL.  Performed by: Personally  Anesthesiologist: Eilene Ghazi, MD  Additional Notes: Patient tolerated the procedure well without complications

## 2020-01-21 NOTE — Discharge Instructions (Signed)

## 2020-01-21 NOTE — Anesthesia Procedure Notes (Signed)
Anesthesia Procedure Image    

## 2020-01-21 NOTE — Anesthesia Preprocedure Evaluation (Signed)
Anesthesia Evaluation  Patient identified by MRN, date of birth, ID band Patient awake    Reviewed: Allergy & Precautions, NPO status , Patient's Chart, lab work & pertinent test results  History of Anesthesia Complications (+) PONV  Airway Mallampati: II  TM Distance: >3 FB Neck ROM: Full    Dental no notable dental hx.    Pulmonary neg pulmonary ROS, former smoker,    Pulmonary exam normal breath sounds clear to auscultation       Cardiovascular hypertension, negative cardio ROS Normal cardiovascular exam Rhythm:Regular Rate:Normal     Neuro/Psych negative neurological ROS  negative psych ROS   GI/Hepatic negative GI ROS, Neg liver ROS,   Endo/Other  negative endocrine ROS  Renal/GU negative Renal ROS  negative genitourinary   Musculoskeletal negative musculoskeletal ROS (+)   Abdominal   Peds negative pediatric ROS (+)  Hematology negative hematology ROS (+)   Anesthesia Other Findings   Reproductive/Obstetrics negative OB ROS                             Anesthesia Physical Anesthesia Plan  ASA: II  Anesthesia Plan: General   Post-op Pain Management:  Regional for Post-op pain   Induction: Intravenous  PONV Risk Score and Plan: 4 or greater and Ondansetron, Dexamethasone, Midazolam, Scopolamine patch - Pre-op and Treatment may vary due to age or medical condition  Airway Management Planned: Oral ETT  Additional Equipment:   Intra-op Plan:   Post-operative Plan: Extubation in OR  Informed Consent: I have reviewed the patients History and Physical, chart, labs and discussed the procedure including the risks, benefits and alternatives for the proposed anesthesia with the patient or authorized representative who has indicated his/her understanding and acceptance.     Dental advisory given  Plan Discussed with: CRNA and Surgeon  Anesthesia Plan Comments:          Anesthesia Quick Evaluation

## 2020-01-21 NOTE — Anesthesia Procedure Notes (Signed)
Procedure Name: Intubation Date/Time: 01/21/2020 7:28 AM Performed by: Eben Burow, CRNA Pre-anesthesia Checklist: Patient identified, Emergency Drugs available, Suction available, Patient being monitored and Timeout performed Patient Re-evaluated:Patient Re-evaluated prior to induction Oxygen Delivery Method: Circle system utilized Preoxygenation: Pre-oxygenation with 100% oxygen Induction Type: IV induction Ventilation: Mask ventilation without difficulty Laryngoscope Size: Mac and 4 Grade View: Grade II Tube type: Oral Tube size: 7.0 mm Number of attempts: 1 Airway Equipment and Method: Stylet Placement Confirmation: ETT inserted through vocal cords under direct vision,  positive ETCO2 and breath sounds checked- equal and bilateral Secured at: 21 cm Tube secured with: Tape Dental Injury: Teeth and Oropharynx as per pre-operative assessment

## 2020-01-21 NOTE — Op Note (Signed)
Procedure(s): REVERSE SHOULDER ARTHROPLASTY Procedure Note  Jaime Myers female 69 y.o. 01/21/2020   Preoperative diagnosis: Right shoulder rotator cuff tear arthropathy  Postoperative diagnosis: Same  Procedure(s) and Anesthesia Type: Right REVERSE SHOULDER ARTHROPLASTY - Choice   Indications:  69 y.o. female  With right shoulder arthropathy and irrepairable rotator cuff tear. Pain and dysfunction interfered with quality of life and nonoperative treatment with activity modification, NSAIDS and injections failed.     Surgeon: Berline Lopes   Assistants: Damita Lack PA-C [facility] was present and scrubbed throughout the procedure and was essential in positioning, retraction, exposure, and closure)  Anesthesia: General endotracheal anesthesia with preoperative interscalene block given by the attending anesthesiologist     Procedure Detail  REVERSE SHOULDER ARTHROPLASTY   Estimated Blood Loss:  200 mL         Drains: none  Blood Given: none          Specimens: none        Complications:  * No complications entered in OR log *         Disposition: PACU - hemodynamically stable.         Condition: stable      OPERATIVE FINDINGS:  A DJO Altivate pressfit reverse total shoulder arthroplasty was placed with a  size 10 stem, a 32-4 glenosphere, and a standard poly insert. The base plate  fixation was excellent.  PROCEDURE: The patient was identified in the preoperative holding area  where I personally marked the operative site after verifying site, side,  and procedure with the patient. An interscalene block given by  the attending anesthesiologist in the holding area and the patient was taken back to the operating room where all extremities were  carefully padded in position after general anesthesia was induced. She  was placed in a beach-chair position and the operative upper extremity was  prepped and draped in a standard sterile fashion. An  approximately 10-  cm incision was made from the tip of the coracoid process to the center  point of the humerus at the level of the axilla. Dissection was carried  down through subcutaneous tissues to the level of the cephalic vein  which was taken laterally with the deltoid. The pectoralis major was  retracted medially. The subdeltoid space was developed and the lateral  edge of the conjoined tendon was identified. The undersurface of  conjoined tendon was palpated and the musculocutaneous nerve was not in  the field. Retractor was placed underneath the conjoined and second  retractor was placed lateral into the deltoid. The circumflex humeral  artery and vessels were identified and clamped and coagulated. The  biceps tendon was absent.  The subscapularis was taken down as a peel with the underlying capsule.  The  joint was then gently externally rotated while the capsule was released  from the humeral neck around to just beyond the 6 o'clock position. At  this point, the joint was dislocated and the humeral head was presented  into the wound. The excessive osteophyte formation was removed with a  large rongeur.  The cutting guide was used to make the appropriate  head cut and the head was saved for potentially bone grafting.  The glenoid was exposed with the arm in an  abducted extended position. The anterior and posterior labrum were  completely excised and the capsule was released circumferentially to  allow for exposure of the glenoid for preparation. The 2.5 mm drill was  placed using the guide in 5-10  inferior angulation and the tap was then advanced in the same hole. Small and large reamers were then used. The tap was then removed and the Metaglene was then screwed in with excellent purchase.  The peripheral guide was then used to drilled measured and filled peripheral locking screws. The size 32-4 glenosphere was then impacted on the [facility] taper and the central screw was placed. The  humerus was then again exposed and the diaphyseal reamers were used followed by the metaphyseal reamers. The final broach was left in place in the proximal trial was placed. The joint was reduced and with this implant it was felt that soft tissue tensioning was appropriate with excellent stability and excellent range of motion. Therefore, final humeral stem was placed press-fit with bone grafting.  And then the trial polyethylene inserts were tested again and the above implant was felt to be the most appropriate for final insertion. The joint was reduced taken through full range of motion and felt to be stable. Soft tissue tension was appropriate.  The joint was then copiously irrigated with pulse  lavage and the wound was then closed. The subscapularis repaired to the lesser tuberosity with a #2 FiberWire through bone tunnels.  Skin was closed with 2-0 Vicryl in a deep dermal layer and 4-0  Monocryl for skin closure. Steri-Strips were applied. Sterile  dressings were then applied as well as a sling. The patient was allowed  to awaken from general anesthesia, transferred to stretcher, and taken  to recovery room in stable condition.   POSTOPERATIVE PLAN: The patient will be kept in the hospital overnight postoperatively for pain control and therapy.

## 2020-01-21 NOTE — Transfer of Care (Signed)
Immediate Anesthesia Transfer of Care Note  Patient: Briann Sarchet  Procedure(s) Performed: REVERSE SHOULDER ARTHROPLASTY (Right Shoulder)  Patient Location: PACU  Anesthesia Type:General  Level of Consciousness: awake, alert  and patient cooperative  Airway & Oxygen Therapy: Patient Spontanous Breathing and Patient connected to face mask oxygen  Post-op Assessment: Report given to RN and Post -op Vital signs reviewed and stable  Post vital signs: Reviewed and stable  Last Vitals:  Vitals Value Taken Time  BP    Temp    Pulse 73 01/21/20 0857  Resp 17 01/21/20 0857  SpO2 100 % 01/21/20 0857  Vitals shown include unvalidated device data.  Last Pain:  Vitals:   01/21/20 0551  TempSrc:   PainSc: 9          Complications: No complications documented.

## 2020-01-21 NOTE — Anesthesia Postprocedure Evaluation (Signed)
Anesthesia Post Note  Patient: Jaime Myers  Procedure(s) Performed: REVERSE SHOULDER ARTHROPLASTY (Right Shoulder)     Patient location during evaluation: PACU Anesthesia Type: General and Regional Level of consciousness: awake and alert, oriented and patient cooperative Pain management: pain level controlled Vital Signs Assessment: post-procedure vital signs reviewed and stable Respiratory status: spontaneous breathing, nonlabored ventilation and respiratory function stable Cardiovascular status: blood pressure returned to baseline and stable Postop Assessment: no apparent nausea or vomiting Anesthetic complications: no   No complications documented.  Last Vitals:  Vitals:   01/21/20 0930 01/21/20 0945  BP: 130/65 128/73  Pulse: 67 68  Resp: 13 10  Temp:    SpO2: 97% 99%    Last Pain:  Vitals:   01/21/20 0945  TempSrc:   PainSc: 0-No pain                 Lannie Fields

## 2020-01-22 ENCOUNTER — Encounter (HOSPITAL_COMMUNITY): Payer: Self-pay | Admitting: Orthopedic Surgery

## 2020-01-22 LAB — BASIC METABOLIC PANEL
Anion gap: 9 (ref 5–15)
BUN: 12 mg/dL (ref 8–23)
CO2: 24 mmol/L (ref 22–32)
Calcium: 8.5 mg/dL — ABNORMAL LOW (ref 8.9–10.3)
Chloride: 100 mmol/L (ref 98–111)
Creatinine, Ser: 1.03 mg/dL — ABNORMAL HIGH (ref 0.44–1.00)
GFR calc Af Amer: >60 mL/min (ref >60)
GFR calc non Af Amer: 56 mL/min — ABNORMAL LOW (ref >60)
Glucose, Bld: 153 mg/dL — ABNORMAL HIGH (ref 70–99)
Potassium: 4.2 mmol/L (ref 3.5–5.1)
Sodium: 133 mmol/L — ABNORMAL LOW (ref 135–145)

## 2020-01-22 LAB — CBC
HCT: 27.9 % — ABNORMAL LOW (ref 36.0–46.0)
Hemoglobin: 9 g/dL — ABNORMAL LOW (ref 12.0–15.0)
MCH: 30.3 pg (ref 26.0–34.0)
MCHC: 32.3 g/dL (ref 30.0–36.0)
MCV: 93.9 fL (ref 80.0–100.0)
Platelets: 223 10*3/uL (ref 150–400)
RBC: 2.97 MIL/uL — ABNORMAL LOW (ref 3.87–5.11)
RDW: 13 % (ref 11.5–15.5)
WBC: 6.5 10*3/uL (ref 4.0–10.5)
nRBC: 0 % (ref 0.0–0.2)

## 2020-01-22 NOTE — Plan of Care (Signed)
Plan of care reviewed and discussed with the patient. 
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
## 2020-01-22 NOTE — Evaluation (Signed)
Occupational Therapy Evaluation Patient Details Name: Jaime Myers MRN: [id number] DOB: 1951-04-18 Today's Date: 01/22/2020    History of Present Illness      S/P reverse total shoulder arthroplasty, right   Clinical Impression   OT education complete. Handout provided    Follow Up Recommendations  Follow surgeon's recommendation for DC plan and follow-up therapies    Equipment Recommendations  None recommended by OT    Recommendations for Other Services       Precautions / Restrictions   No shoulder movement. Elbow, wrist and hand movement allowed     Mobility Bed Mobility Overal bed mobility: Modified Independent                Transfers Overall transfer level: Modified independent                    Balance Overall balance assessment: Mild deficits observed, not formally tested                                         ADL either performed or assessed with clinical judgement          Cognition Arousal/Alertness: Awake/alert Behavior During Therapy: WFL for tasks assessed/performed Overall Cognitive Status: Within Functional Limits for tasks assessed                                           Shoulder Instructions Shoulder Instructions Donning/doffing shirt without moving shoulder: Minimal assistance;Patient able to independently direct caregiver Method for sponge bathing under operated UE: Minimal assistance;Patient able to independently direct caregiver Donning/doffing sling/immobilizer: Minimal assistance;Patient able to independently direct caregiver Correct positioning of sling/immobilizer: Minimal assistance;Patient able to independently direct caregiver ROM for elbow, wrist and digits of operated UE: Minimal assistance;Patient able to independently direct caregiver Sling wearing schedule (on at all times/off for ADL's): Minimal assistance;Patient able to independently direct caregiver Proper positioning  of operated UE when showering: Minimal assistance;Patient able to independently direct caregiver Positioning of UE while sleeping: Minimal assistance;Patient able to independently direct caregiver    Home Living Family/patient expects to be discharged to:: Private residence Living Arrangements: Spouse/significant other Available Help at Discharge: Family                                     OT Goals(Current goals can be found in the care plan section) Acute Rehab OT Goals Patient Stated Goal: home today OT Goal Formulation: With patient Time For Goal Achievement: 01/22/20  OT Frequency:                AM-PAC OT "6 Clicks" Daily Activity     Outcome Measure Help from another person eating meals?: None Help from another person taking care of personal grooming?: None Help from another person toileting, which includes using toliet, bedpan, or urinal?: A Little Help from another person bathing (including washing, rinsing, drying)?: A Little Help from another person to put on and taking off regular upper body clothing?: A Little Help from another person to put on and taking off regular lower body clothing?: A Little 6 Click Score: 20   End of Session Nurse Communication: Mobility status  Activity Tolerance: Patient tolerated treatment well Patient left:  in chair;with call bell/phone within reach                   Time: 0950-1016 OT Time Calculation (min): 26 min Charges:  OT General Charges $OT Visit: 1 Visit OT Evaluation $OT Eval Low Complexity: 1 Low OT Treatments $Self Care/Home Management : 8-22 mins  Lise Auer, OT Acute Rehabilitation Services [phone] Office- [phone]     Rexine Gowens, Karin Golden D 01/22/2020, 2:25 PM

## 2020-01-22 NOTE — Plan of Care (Signed)
  Problem: Education: Goal: Knowledge of the prescribed therapeutic regimen will improve Outcome: Adequate for Discharge   Problem: Education: Goal: Understanding of activity limitations/precautions following surgery will improve Outcome: Adequate for Discharge   Problem: Activity: Goal: Ability to tolerate increased activity will improve Outcome: Adequate for Discharge   Problem: Pain Management: Goal: Pain level will decrease with appropriate interventions Outcome: Adequate for Discharge   Problem: Education: Goal: Knowledge of General Education information will improve Description: Including pain rating scale, medication(s)/side effects and non-pharmacologic comfort measures Outcome: Adequate for Discharge   Problem: Health Behavior/Discharge Planning: Goal: Ability to manage health-related needs will improve Outcome: Adequate for Discharge   Problem: Elimination: Goal: Will not experience complications related to bowel motility Outcome: Adequate for Discharge   Problem: Pain Managment: Goal: General experience of comfort will improve Outcome: Adequate for Discharge   Problem: Safety: Goal: Ability to remain free from injury will improve Outcome: Adequate for Discharge   Problem: Skin Integrity: Goal: Risk for impaired skin integrity will decrease Outcome: Adequate for Discharge

## 2022-03-30 IMAGING — DX DG SHOULDER 2+V PORT*R*
1 series · 1 of 1 positions shown · non-contrast
Comparison: None.

CLINICAL DATA: Right shoulder arthroplasty

EXAM:
PORTABLE RIGHT SHOULDER

[shoulder ap]
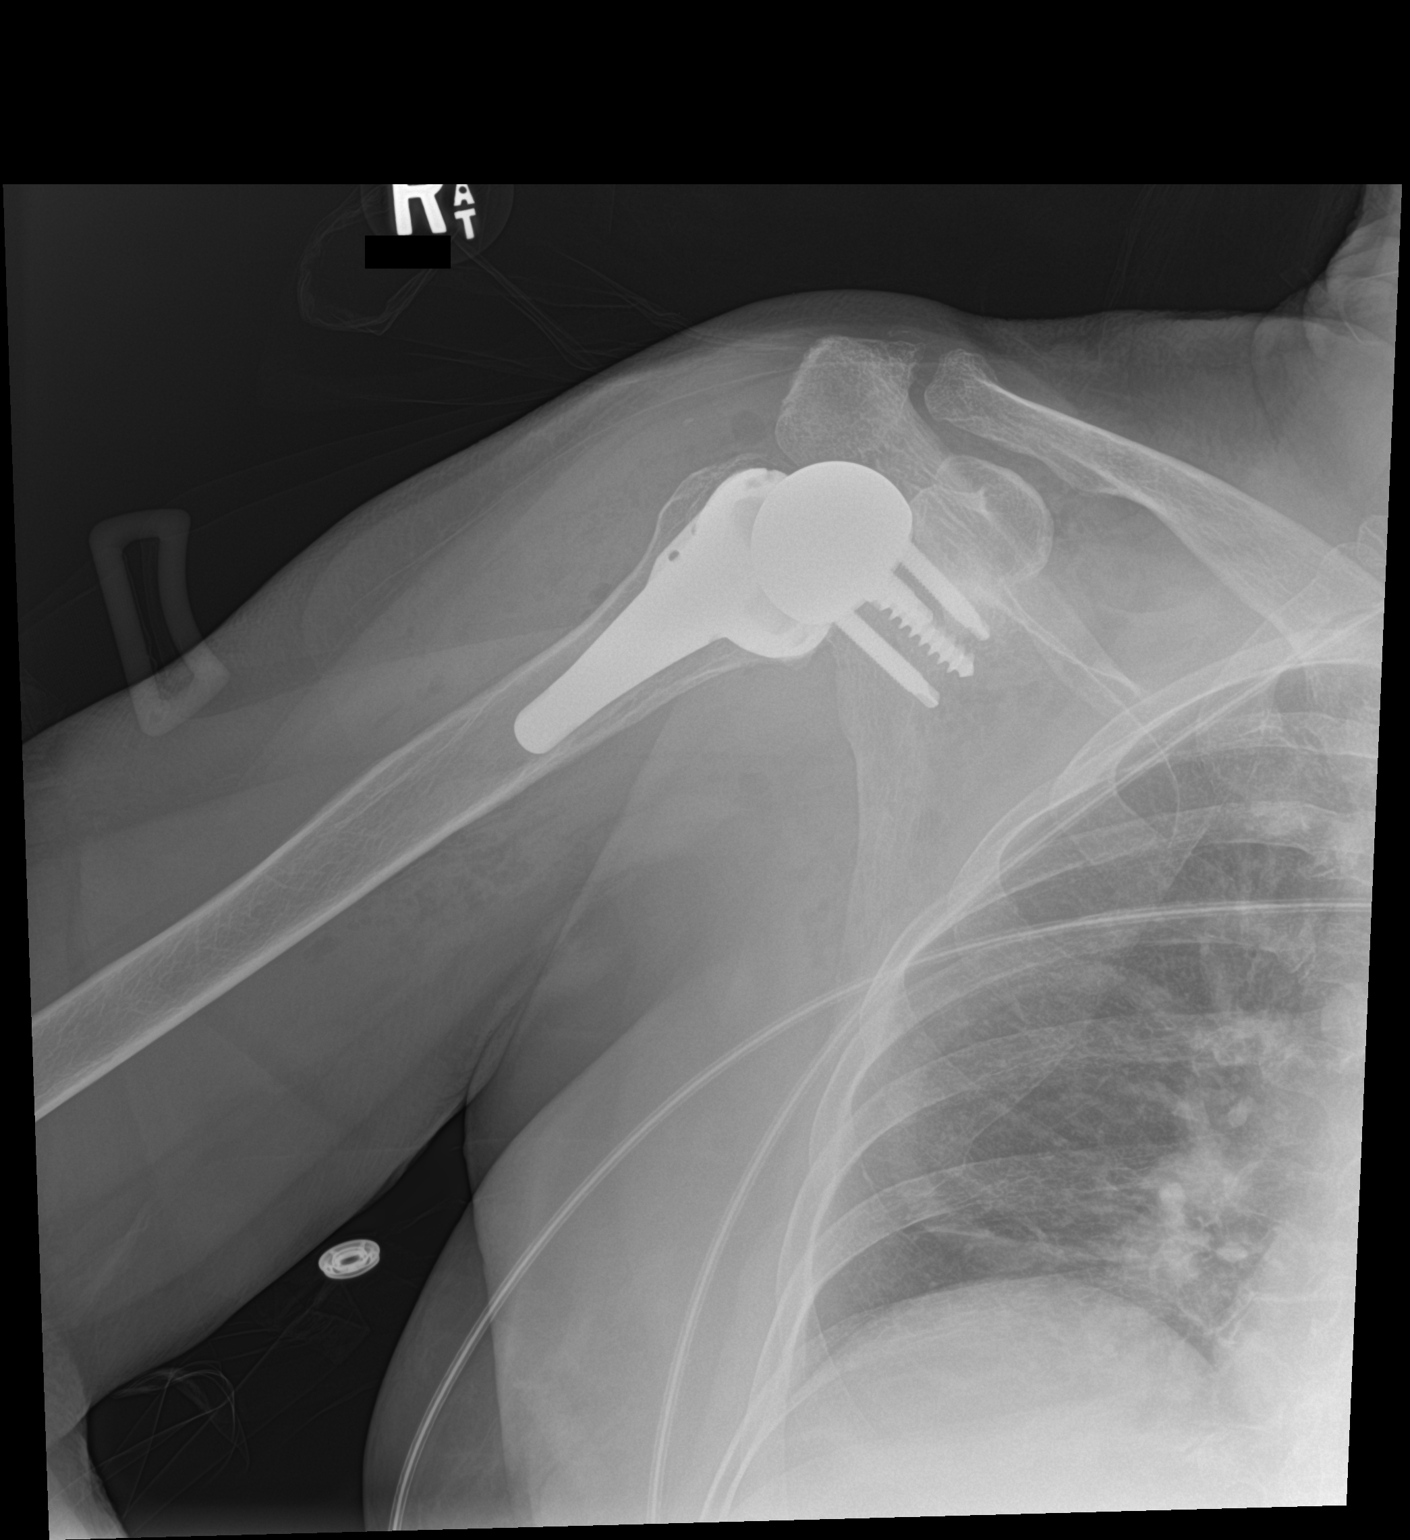

[1 of 1 positions shown; findings below may reference images not displayed]

FINDINGS: Changes of right shoulder replacement. Normal AP alignment. No
hardware complicating feature.
IMPRESSION: Right shoulder replacement.  No visible complicating feature.
